# Patient Record
Sex: Male | Born: 2000 | Race: Black or African American | Hispanic: No | Marital: Single | Smoking: Never smoker
Health system: Southern US, Community
[De-identification: ages and names within clinical notes are randomized; demographics above are authoritative.]

## PROBLEM LIST (undated history)

## (undated) DIAGNOSIS — R6250 Unspecified lack of expected normal physiological development in childhood: Secondary | ICD-10-CM

## (undated) DIAGNOSIS — E230 Hypopituitarism: Secondary | ICD-10-CM

## (undated) DIAGNOSIS — H548 Legal blindness, as defined in USA: Secondary | ICD-10-CM

## (undated) DIAGNOSIS — Q044 Septo-optic dysplasia of brain: Secondary | ICD-10-CM

## (undated) HISTORY — DX: Hypopituitarism: E23.0

## (undated) HISTORY — DX: Septo-optic dysplasia of brain: Q04.4

## (undated) HISTORY — DX: Unspecified lack of expected normal physiological development in childhood: R62.50

## (undated) HISTORY — PX: EYE SURGERY: SHX253

## (undated) HISTORY — DX: Legal blindness, as defined in USA: H54.8

---
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
## 2020-04-10 ENCOUNTER — Ambulatory Visit (HOSPITAL_COMMUNITY)
Admission: EM | Admit: 2020-04-10 | Discharge: 2020-04-10 | Disposition: A | Discharge status: Home / Self Care | Payer: Medicaid Other | Attending: Emergency Medicine | Admitting: Emergency Medicine

## 2020-04-10 ENCOUNTER — Other Ambulatory Visit: Payer: Self-pay

## 2020-04-10 ENCOUNTER — Encounter (HOSPITAL_COMMUNITY): Payer: Self-pay

## 2020-04-10 DIAGNOSIS — Z20822 Contact with and (suspected) exposure to covid-19: Secondary | ICD-10-CM | POA: Insufficient documentation

## 2020-04-10 DIAGNOSIS — J069 Acute upper respiratory infection, unspecified: Secondary | ICD-10-CM | POA: Insufficient documentation

## 2020-04-10 DIAGNOSIS — Q044 Septo-optic dysplasia of brain: Secondary | ICD-10-CM | POA: Insufficient documentation

## 2020-04-10 DIAGNOSIS — Z79899 Other long term (current) drug therapy: Secondary | ICD-10-CM | POA: Insufficient documentation

## 2020-04-10 DIAGNOSIS — J029 Acute pharyngitis, unspecified: Secondary | ICD-10-CM | POA: Insufficient documentation

## 2020-04-10 DIAGNOSIS — E230 Hypopituitarism: Secondary | ICD-10-CM | POA: Insufficient documentation

## 2020-04-10 DIAGNOSIS — H548 Legal blindness, as defined in USA: Secondary | ICD-10-CM | POA: Diagnosis not present

## 2020-04-10 DIAGNOSIS — R05 Cough: Secondary | ICD-10-CM | POA: Insufficient documentation

## 2020-04-10 MED ORDER — CETIRIZINE HCL 10 MG PO TABS: 10.0000 mg | ORAL_TABLET | Freq: Every day | ORAL | 0 refills | Status: AC

## 2020-04-10 MED ORDER — FLUTICASONE PROPIONATE 50 MCG/ACT NA SUSP: 1.0000 | Freq: Every day | NASAL | 2 refills | Status: AC

## 2020-04-10 NOTE — ED Provider Notes (Signed)
[place]    CSN: [id number] Arrival date & time: 04/10/20  0813      History   Chief Complaint Chief Complaint  Patient presents with  . Cough  . Sore Throat    HPI Oscar Richardson is a 19 y.o. male.   Patient is a 19 year old male the presents today with proxy 4 days of productive cough, sore throat, runny nose, diarrhea.  Symptoms been constant.  Has been taking Robitussin without much relief.  No known sick contacts.  Denies any fever, headache, body aches, chills, nausea, vomiting.  Planning to go out of town tomorrow for family gathering and would like to be Covid tested.  ROS per HPI      Past Medical History:  Diagnosis Date  . Development delay   . Growth hormone deficiency ([place])   . Legal blindness   . Septo-optic dysplasia [facility]     Patient Active Problem List   Diagnosis Date Noted  . Delayed linear growth 08/13/2015  . Puberty delay 08/13/2015  . Visual loss 08/13/2015  . Abnormal finding on MRI of brain 10/31/2014  . Growth failure 10/31/2014  . Well child check 04/20/2014  . Overweight child 04/20/2014  . Septo-optic dysplasia ([place])   . Growth hormone deficiency ([place])   . Optic nerve hypoplasia 12/29/2012  . Pituitary abnormality ([place]) 12/29/2012  . Behavior problem in child 12/29/2012  . RHINITIS, ALLERGIC 01/20/2007  . ASTHMA, UNSPECIFIED 01/20/2007  . ECZEMA, ATOPIC DERMATITIS 01/20/2007  . Lack of expected normal physiological development in childhood 01/20/2007    Past Surgical History:  Procedure Laterality Date  . EYE SURGERY         Home Medications    Prior to Admission medications   Medication Sig Start Date End Date Taking? Authorizing Provider  cetirizine (ZYRTEC) 10 MG tablet Take 1 tablet (10 mg total) by mouth daily. 04/10/20   Marvelous Bouwens, Tressia Miners A, NP  fluticasone (FLONASE) 50 MCG/ACT nasal spray Place 1 spray into both nostrils daily. 04/10/20   Loura Halt A, NP  ibuprofen (ADVIL,MOTRIN) 400 MG tablet Take 1  tablet (400 mg total) by mouth every 6 (six) hours as needed. 02/12/18   Zigmund Gottron, NP    Family History Family History  Problem Relation Age of Onset  . Diabetes Maternal Grandmother   . Hypertension Maternal Grandfather     Social History Social History   Tobacco Use  . Smoking status: Never Smoker  Substance Use Topics  . Alcohol use: No  . Drug use: Not on file     Allergies   Patient has no known allergies.   Review of Systems Review of Systems   Physical Exam Triage Vital Signs ED Triage Vitals [04/10/20 0904]  Enc Vitals Group     BP (!) 114/58     Pulse Rate (!) 102     Resp 20     Temp 98.9 F (37.2 C)     Temp Source Oral     SpO2 100 %     Weight      Height      Head Circumference      Peak Flow      Pain Score 2     Pain Loc      Pain Edu?      Excl. in Normal?    No data found.  Updated Vital Signs BP (!) 114/58 (BP Location: Left Arm)   Pulse (!) 102   Temp 98.9 F (37.2  C) (Oral)   Resp 20   SpO2 100%   Visual Acuity Right Eye Distance:   Left Eye Distance:   Bilateral Distance:    Right Eye Near:   Left Eye Near:    Bilateral Near:     Physical Exam Vitals and nursing note reviewed.  Constitutional:      Appearance: Normal appearance.  HENT:     Head: Normocephalic and atraumatic.     Nose: Congestion and rhinorrhea present.     Mouth/Throat:     Pharynx: Oropharynx is clear. No posterior oropharyngeal erythema.  Eyes:     Conjunctiva/sclera: Conjunctivae normal.  Cardiovascular:     Rate and Rhythm: Normal rate and regular rhythm.  Pulmonary:     Effort: Pulmonary effort is normal.     Breath sounds: Normal breath sounds.  Musculoskeletal:        General: Normal range of motion.     Cervical back: Normal range of motion.  Skin:    General: Skin is warm and dry.  Neurological:     Mental Status: He is alert.  Psychiatric:        Mood and Affect: Mood normal.      UC Treatments / Results  Labs (all  labs ordered are listed, but only abnormal results are displayed) Labs Reviewed  SARS CORONAVIRUS 2 (TAT 6-24 HRS)    EKG   Radiology No results found.  Procedures Procedures (including critical care time)  Medications Ordered in UC Medications - No data to display  Initial Impression / Assessment and Plan / UC Course  I have reviewed the triage vital signs and the nursing notes.  Pertinent labs & imaging results that were available during my care of the patient were reviewed by me and considered in my medical decision making (see chart for details).     Viral URI with cough Lungs clear on exam.  Covid swab pending Recommend over-the-counter occasions as needed. We will add Zyrtec and Flonase for symptoms in addition. Follow up as needed for continued or worsening symptoms  Final Clinical Impressions(s) / UC Diagnoses   Final diagnoses:  Viral URI with cough     Discharge Instructions     You can continue with over-the-counter medications as needed.  Flonase and Zyrtec for symptoms Follow up as needed for continued or worsening symptoms     ED Prescriptions    Medication Sig Dispense Auth. Provider   cetirizine (ZYRTEC) 10 MG tablet Take 1 tablet (10 mg total) by mouth daily. 30 tablet Annisten Manchester A, NP   fluticasone (FLONASE) 50 MCG/ACT nasal spray Place 1 spray into both nostrils daily. 16 g Dahlia Byes A, NP     PDMP not reviewed this encounter.   Janace Aris, NP 04/10/20 [phone]

## 2020-04-10 NOTE — Discharge Instructions (Addendum)
You can continue with over-the-counter medications as needed.  Flonase and Zyrtec for symptoms Follow up as needed for continued or worsening symptoms

## 2020-04-10 NOTE — ED Triage Notes (Signed)
Pt c/o acute onset productive cough, sore throat, runny nose, diarrhea for approx 4 days. Robitussin not improving symptoms.  Denies abdom pain, n/v, fever chills, HA, body aches.  Pt states he is scheduled to leave out of town tomorrow for a family gathering

## 2020-04-11 LAB — SARS CORONAVIRUS 2 (TAT 6-24 HRS): SARS Coronavirus 2: NEGATIVE
